# Patient Record
Sex: Female | Born: 1935 | Race: White | Hispanic: No
Health system: Southern US, Community
[De-identification: ages and names within clinical notes are randomized; demographics above are authoritative.]

---

## 1997-06-30 ENCOUNTER — Other Ambulatory Visit: Admission: RE | Admit: 1997-06-30 | Discharge: 1997-06-30 | Payer: Self-pay | Admitting: Obstetrics and Gynecology

## 1997-07-29 ENCOUNTER — Other Ambulatory Visit: Admission: RE | Admit: 1997-07-29 | Discharge: 1997-07-29 | Payer: Self-pay | Admitting: Obstetrics and Gynecology

## 1997-08-27 ENCOUNTER — Encounter: Admission: RE | Admit: 1997-08-27 | Discharge: 1997-11-25 | Payer: Self-pay | Admitting: Gynecology

## 1997-09-04 ENCOUNTER — Encounter: Admission: RE | Admit: 1997-09-04 | Discharge: 1997-12-03 | Payer: Self-pay | Admitting: *Deleted

## 1997-10-28 ENCOUNTER — Inpatient Hospital Stay (HOSPITAL_COMMUNITY): Admission: RE | Admit: 1997-10-28 | Discharge: 1997-10-30 | Payer: Self-pay | Admitting: *Deleted

## 1997-12-03 ENCOUNTER — Ambulatory Visit: Admission: RE | Admit: 1997-12-03 | Discharge: 1997-12-03 | Payer: Self-pay | Admitting: Gynecology

## 1998-01-22 ENCOUNTER — Ambulatory Visit (HOSPITAL_COMMUNITY): Admission: RE | Admit: 1998-01-22 | Discharge: 1998-01-22 | Payer: Self-pay | Admitting: Obstetrics and Gynecology

## 1998-01-22 ENCOUNTER — Encounter: Payer: Self-pay | Admitting: Obstetrics and Gynecology

## 1998-02-12 ENCOUNTER — Other Ambulatory Visit: Admission: RE | Admit: 1998-02-12 | Discharge: 1998-02-12 | Payer: Self-pay | Admitting: Obstetrics and Gynecology

## 1998-03-20 ENCOUNTER — Encounter: Payer: Self-pay | Admitting: Obstetrics and Gynecology

## 1998-03-20 ENCOUNTER — Ambulatory Visit (HOSPITAL_COMMUNITY): Admission: RE | Admit: 1998-03-20 | Discharge: 1998-03-20 | Payer: Self-pay | Admitting: Obstetrics and Gynecology

## 1998-06-22 ENCOUNTER — Other Ambulatory Visit: Admission: RE | Admit: 1998-06-22 | Discharge: 1998-06-22 | Payer: Self-pay | Admitting: Obstetrics and Gynecology

## 1998-09-14 ENCOUNTER — Ambulatory Visit (HOSPITAL_COMMUNITY): Admission: RE | Admit: 1998-09-14 | Discharge: 1998-09-14 | Payer: Self-pay | Admitting: Obstetrics and Gynecology

## 1998-09-14 ENCOUNTER — Encounter: Payer: Self-pay | Admitting: Obstetrics and Gynecology

## 1998-09-29 ENCOUNTER — Other Ambulatory Visit: Admission: RE | Admit: 1998-09-29 | Discharge: 1998-09-29 | Payer: Self-pay | Admitting: Obstetrics and Gynecology

## 1999-01-24 ENCOUNTER — Emergency Department (HOSPITAL_COMMUNITY): Admission: EM | Admit: 1999-01-24 | Discharge: 1999-01-24 | Payer: Self-pay

## 1999-03-14 ENCOUNTER — Encounter: Payer: Self-pay | Admitting: Emergency Medicine

## 1999-03-14 ENCOUNTER — Emergency Department (HOSPITAL_COMMUNITY): Admission: EM | Admit: 1999-03-14 | Discharge: 1999-03-14 | Payer: Self-pay | Admitting: Emergency Medicine

## 1999-04-16 ENCOUNTER — Ambulatory Visit (HOSPITAL_COMMUNITY): Admission: RE | Admit: 1999-04-16 | Discharge: 1999-04-16 | Payer: Self-pay | Admitting: Gastroenterology

## 1999-04-16 ENCOUNTER — Encounter: Payer: Self-pay | Admitting: Gastroenterology

## 1999-06-21 ENCOUNTER — Other Ambulatory Visit: Admission: RE | Admit: 1999-06-21 | Discharge: 1999-06-21 | Payer: Self-pay | Admitting: Obstetrics and Gynecology

## 1999-09-21 ENCOUNTER — Encounter: Admission: RE | Admit: 1999-09-21 | Discharge: 1999-09-21 | Payer: Self-pay | Admitting: Obstetrics and Gynecology

## 1999-09-21 ENCOUNTER — Encounter: Payer: Self-pay | Admitting: Obstetrics and Gynecology

## 2000-01-19 ENCOUNTER — Encounter: Admission: RE | Admit: 2000-01-19 | Discharge: 2000-01-19 | Payer: Self-pay | Admitting: Gastroenterology

## 2000-01-19 ENCOUNTER — Encounter: Payer: Self-pay | Admitting: Gastroenterology

## 2000-01-28 ENCOUNTER — Encounter: Payer: Self-pay | Admitting: Gastroenterology

## 2000-01-28 ENCOUNTER — Encounter: Admission: RE | Admit: 2000-01-28 | Discharge: 2000-01-28 | Payer: Self-pay | Admitting: Gastroenterology

## 2000-02-29 ENCOUNTER — Ambulatory Visit (HOSPITAL_COMMUNITY): Admission: RE | Admit: 2000-02-29 | Discharge: 2000-02-29 | Payer: Self-pay | Admitting: Gastroenterology

## 2000-10-20 ENCOUNTER — Encounter: Payer: Self-pay | Admitting: Emergency Medicine

## 2000-10-20 ENCOUNTER — Encounter: Payer: Self-pay | Admitting: Orthopedic Surgery

## 2000-10-20 ENCOUNTER — Inpatient Hospital Stay (HOSPITAL_COMMUNITY): Admission: EM | Admit: 2000-10-20 | Discharge: 2000-10-23 | Payer: Self-pay | Admitting: Emergency Medicine

## 2001-04-16 ENCOUNTER — Other Ambulatory Visit: Admission: RE | Admit: 2001-04-16 | Discharge: 2001-04-16 | Payer: Self-pay | Admitting: Obstetrics and Gynecology

## 2001-05-15 ENCOUNTER — Encounter: Payer: Self-pay | Admitting: Gastroenterology

## 2001-05-15 ENCOUNTER — Encounter: Admission: RE | Admit: 2001-05-15 | Discharge: 2001-05-15 | Payer: Self-pay | Admitting: Gastroenterology

## 2001-08-03 ENCOUNTER — Encounter: Admission: RE | Admit: 2001-08-03 | Discharge: 2001-08-03 | Payer: Self-pay | Admitting: Obstetrics and Gynecology

## 2001-08-03 ENCOUNTER — Encounter: Payer: Self-pay | Admitting: Obstetrics and Gynecology

## 2001-08-30 ENCOUNTER — Emergency Department (HOSPITAL_COMMUNITY): Admission: EM | Admit: 2001-08-30 | Discharge: 2001-08-30 | Payer: Self-pay | Admitting: Emergency Medicine

## 2001-08-30 ENCOUNTER — Encounter: Payer: Self-pay | Admitting: Emergency Medicine

## 2002-12-07 ENCOUNTER — Inpatient Hospital Stay (HOSPITAL_COMMUNITY): Admission: RE | Admit: 2002-12-07 | Discharge: 2002-12-10 | Payer: Self-pay | Admitting: Family Medicine

## 2002-12-08 ENCOUNTER — Encounter: Payer: Self-pay | Admitting: Family Medicine

## 2002-12-11 ENCOUNTER — Encounter: Admission: RE | Admit: 2002-12-11 | Discharge: 2002-12-11 | Payer: Self-pay | Admitting: Family Medicine

## 2003-03-19 ENCOUNTER — Emergency Department (HOSPITAL_COMMUNITY): Admission: EM | Admit: 2003-03-19 | Discharge: 2003-03-19 | Payer: Self-pay | Admitting: Emergency Medicine

## 2003-03-26 ENCOUNTER — Ambulatory Visit (HOSPITAL_COMMUNITY): Admission: RE | Admit: 2003-03-26 | Discharge: 2003-03-26 | Payer: Self-pay | Admitting: Gastroenterology

## 2003-07-23 ENCOUNTER — Ambulatory Visit (HOSPITAL_COMMUNITY): Admission: RE | Admit: 2003-07-23 | Discharge: 2003-07-23 | Payer: Self-pay | Admitting: Gastroenterology

## 2004-12-26 ENCOUNTER — Emergency Department: Payer: Self-pay | Admitting: Emergency Medicine

## 2005-01-07 ENCOUNTER — Ambulatory Visit: Payer: Self-pay | Admitting: Unknown Physician Specialty

## 2005-02-01 ENCOUNTER — Inpatient Hospital Stay: Payer: Self-pay | Admitting: Unknown Physician Specialty

## 2005-02-01 ENCOUNTER — Other Ambulatory Visit: Payer: Self-pay

## 2005-02-10 ENCOUNTER — Encounter: Payer: Self-pay | Admitting: Internal Medicine

## 2005-02-11 ENCOUNTER — Encounter: Payer: Self-pay | Admitting: Internal Medicine

## 2005-03-14 ENCOUNTER — Encounter: Payer: Self-pay | Admitting: Internal Medicine

## 2005-04-08 ENCOUNTER — Ambulatory Visit: Payer: Self-pay | Admitting: Internal Medicine

## 2005-04-14 ENCOUNTER — Encounter: Payer: Self-pay | Admitting: Internal Medicine

## 2005-05-13 ENCOUNTER — Inpatient Hospital Stay: Payer: Self-pay | Admitting: Internal Medicine

## 2005-05-13 ENCOUNTER — Other Ambulatory Visit: Payer: Self-pay

## 2005-05-20 ENCOUNTER — Encounter: Payer: Self-pay | Admitting: Internal Medicine

## 2005-06-12 ENCOUNTER — Encounter: Payer: Self-pay | Admitting: Internal Medicine

## 2005-07-12 ENCOUNTER — Encounter: Payer: Self-pay | Admitting: Internal Medicine

## 2005-08-12 ENCOUNTER — Encounter: Payer: Self-pay | Admitting: Internal Medicine

## 2005-09-11 ENCOUNTER — Encounter: Payer: Self-pay | Admitting: Internal Medicine

## 2005-10-12 ENCOUNTER — Encounter: Payer: Self-pay | Admitting: Internal Medicine

## 2005-11-12 ENCOUNTER — Encounter: Payer: Self-pay | Admitting: Internal Medicine

## 2005-12-05 ENCOUNTER — Emergency Department: Payer: Self-pay | Admitting: Emergency Medicine

## 2005-12-12 ENCOUNTER — Encounter: Payer: Self-pay | Admitting: Internal Medicine

## 2006-01-07 ENCOUNTER — Emergency Department: Payer: Self-pay | Admitting: Internal Medicine

## 2006-01-12 ENCOUNTER — Encounter: Payer: Self-pay | Admitting: Internal Medicine

## 2006-04-01 ENCOUNTER — Emergency Department: Payer: Self-pay | Admitting: General Practice

## 2006-08-13 ENCOUNTER — Ambulatory Visit: Payer: Self-pay | Admitting: Internal Medicine

## 2008-03-24 ENCOUNTER — Emergency Department: Payer: Self-pay | Admitting: Emergency Medicine

## 2008-08-30 ENCOUNTER — Emergency Department: Payer: Self-pay | Admitting: Emergency Medicine

## 2008-10-09 ENCOUNTER — Emergency Department: Payer: Self-pay | Admitting: Emergency Medicine

## 2008-11-11 ENCOUNTER — Inpatient Hospital Stay: Payer: Self-pay | Admitting: Internal Medicine

## 2010-04-04 ENCOUNTER — Encounter: Payer: Self-pay | Admitting: Gastroenterology

## 2010-12-02 ENCOUNTER — Emergency Department: Payer: Self-pay | Admitting: Internal Medicine

## 2011-04-20 ENCOUNTER — Emergency Department: Payer: Self-pay

## 2011-05-20 IMAGING — CT CT HEAD WITHOUT CONTRAST
2 series · 16 of 30 positions shown, 20 images · non-contrast
Comparison: none

REASON FOR EXAM: fall
COMMENTS:

[Series 2: without · axial · non-contrast · 0.44mm/px · z∈[-236,-116]mm · 13 of 30 slices shown, 17 images]
[im 3/30  brain]
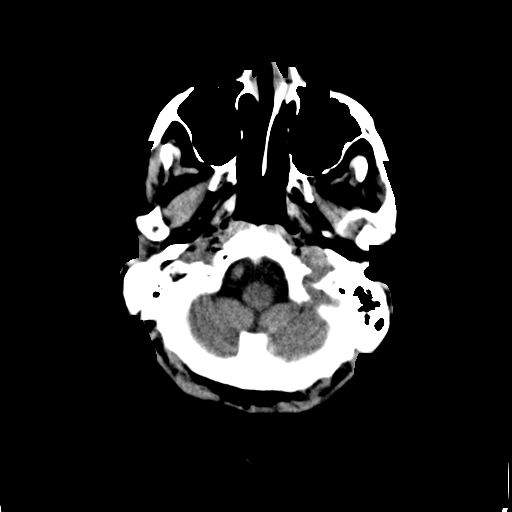
[im 3/30  bone]
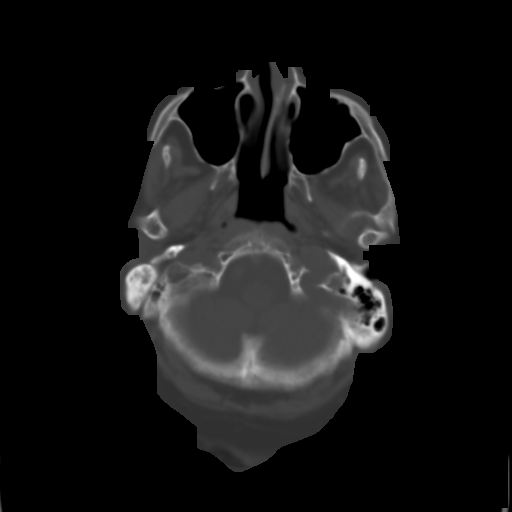
[im 5/30  brain]
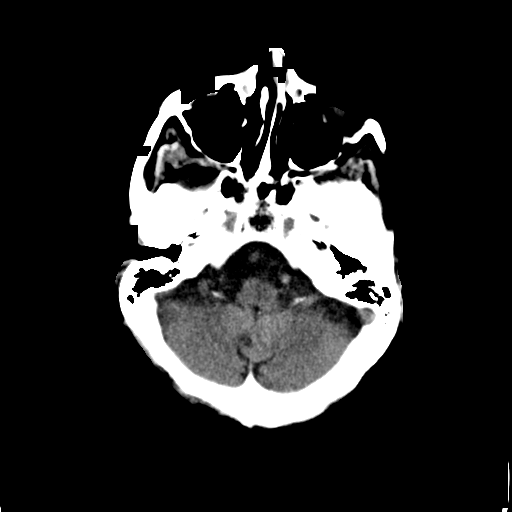
[im 7/30  brain]
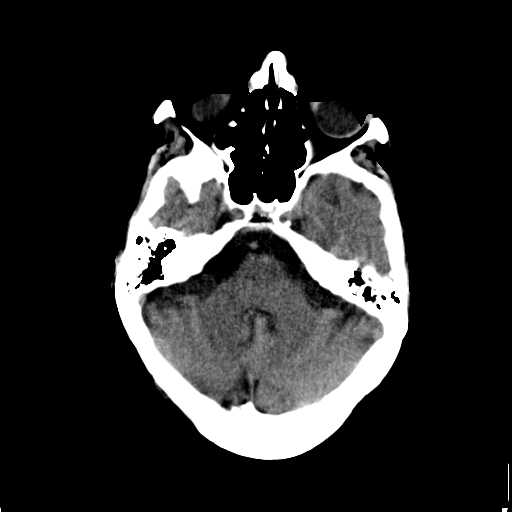
[im 9/30  brain]
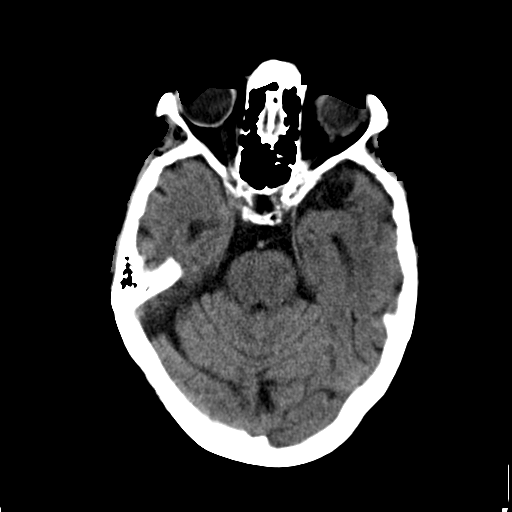
[im 11/30  brain]
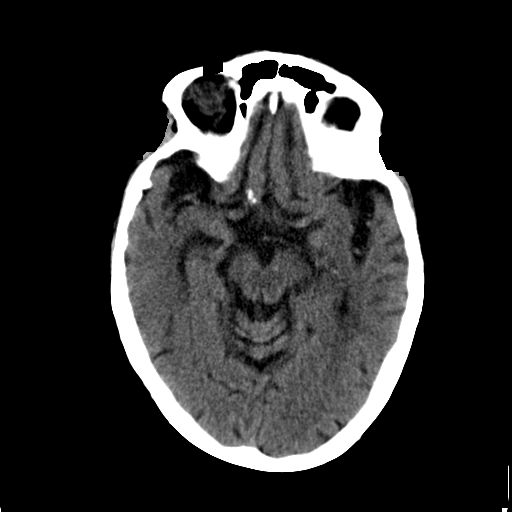
[im 11/30  bone]
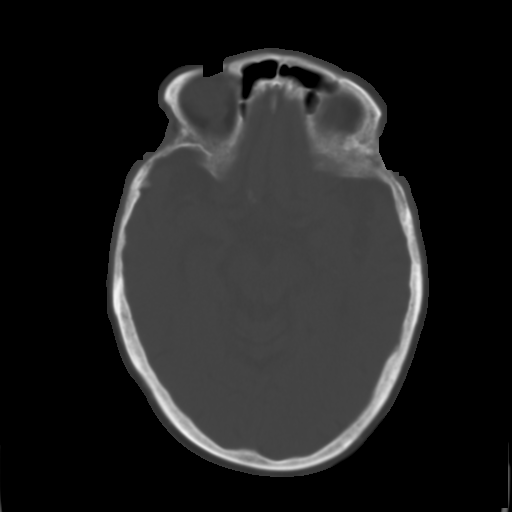
[im 13/30  brain]
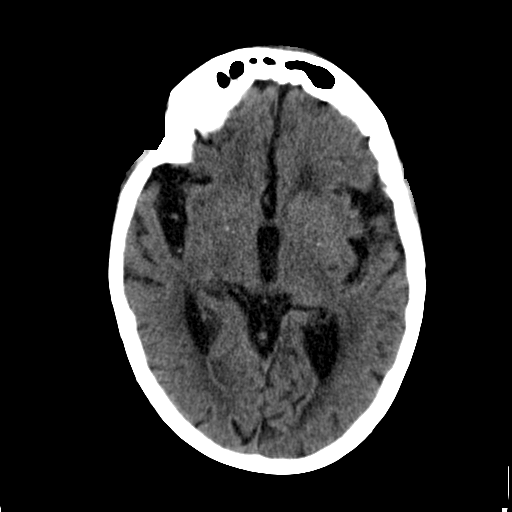
[im 15/30  brain]
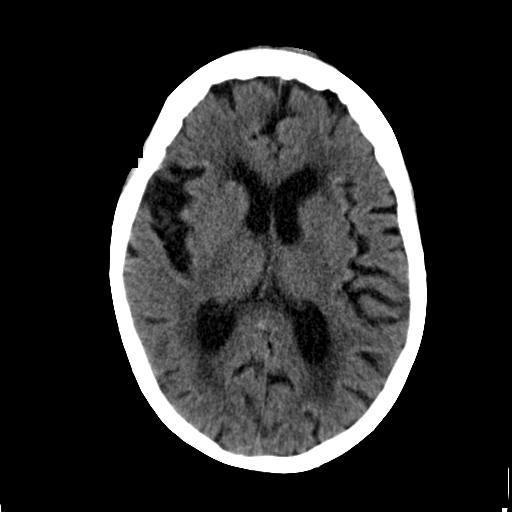
[im 17/30  brain]
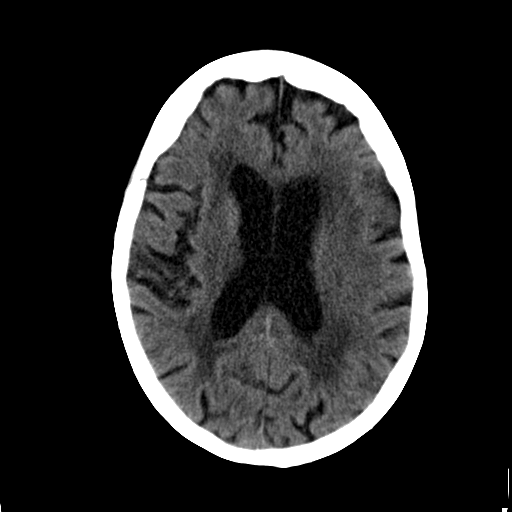
[im 19/30  brain]
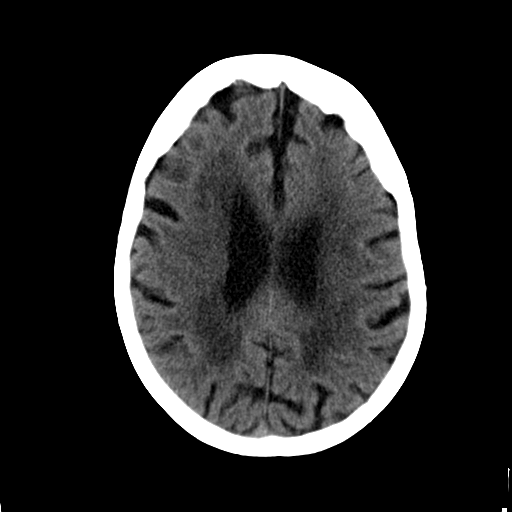
[im 19/30  bone]
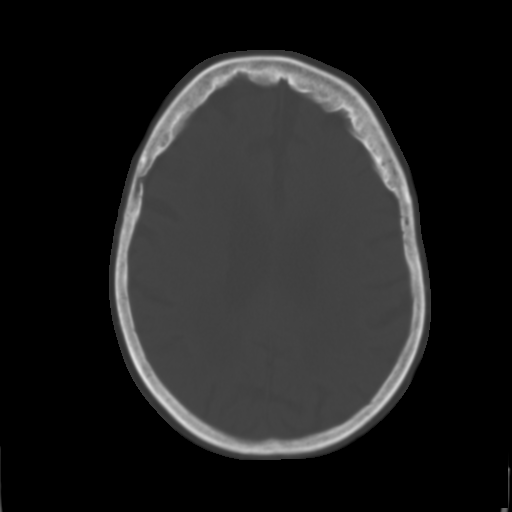
[im 21/30  brain]
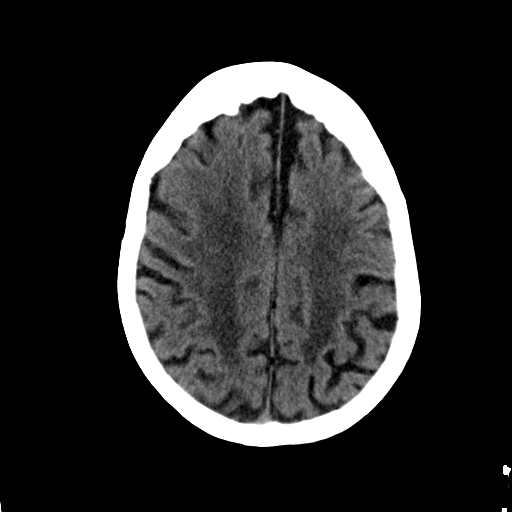
[im 23/30  brain]
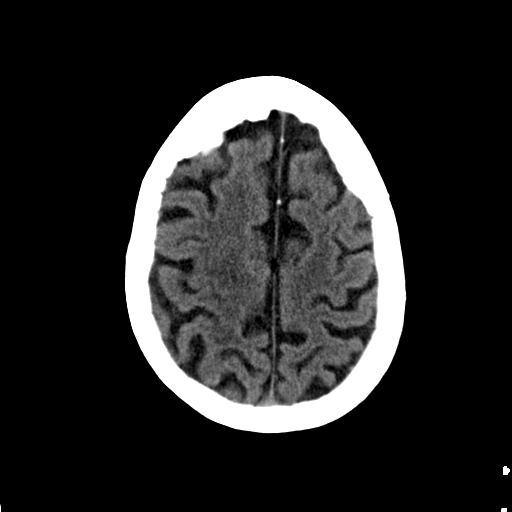
[im 25/30  brain]
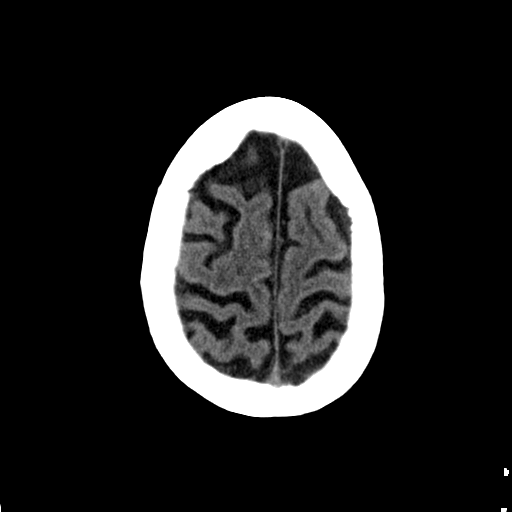
[im 27/30  brain]
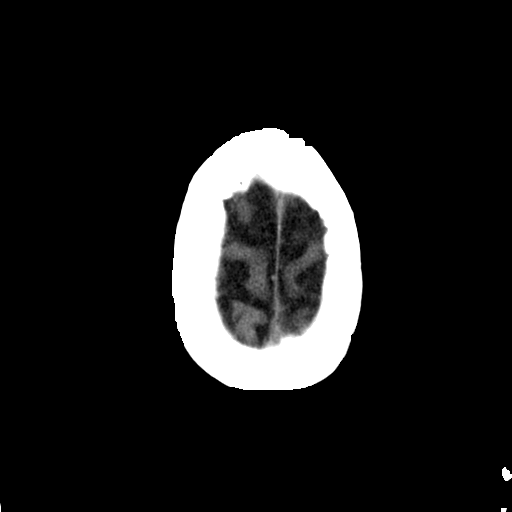
[im 27/30  bone]
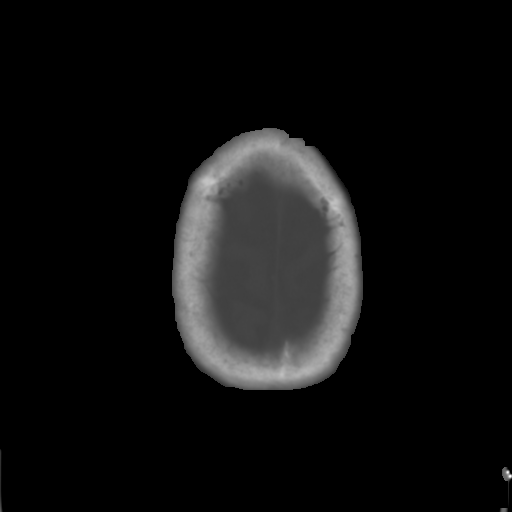

[Series 3: bone · axial · 0.44mm/px · z∈[-236,-196]mm · 3 of 30 slices shown]
[im 3/30  bone]
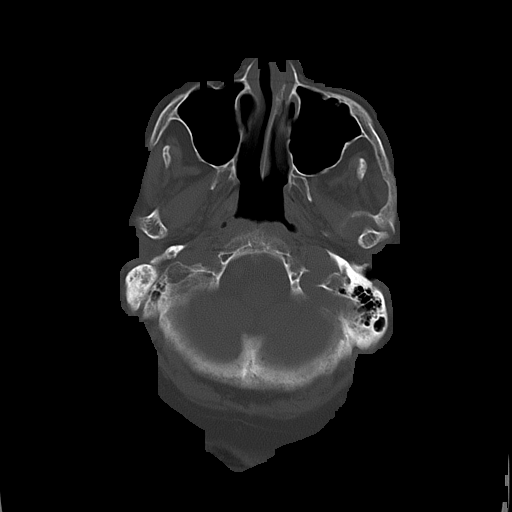
[im 7/30  bone]
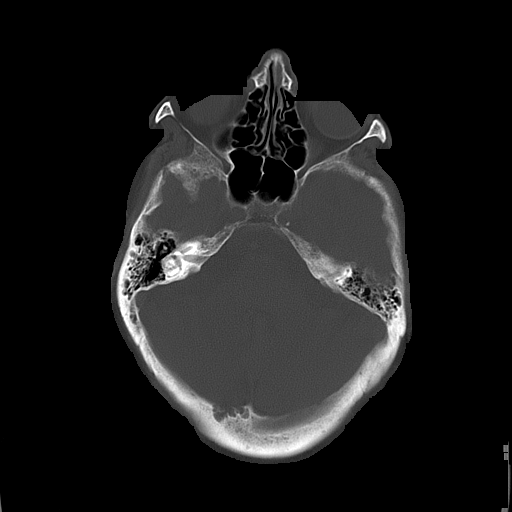
[im 11/30  bone]
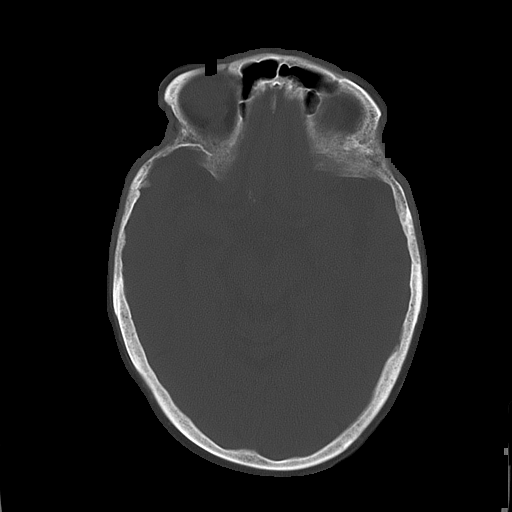

[16 of 30 positions shown; findings below may reference images not displayed]

PROCEDURE:     CT  - CT HEAD WITHOUT CONTRAST  - October 09, 2008  [DATE]

RESULT:     Noncontrast emergent CT of the brain is compared to a study of
05/13/2005.

There is prominence of the ventricles and sulci consistent with atrophy.
There is ill-defined low-attenuation in the periventricular and subcortical
white matter regions which is most consistent with chronic small vessel
ischemic disease. There is no parenchymal or extra-axial hemorrhage or
hematoma. There is no mass effect or midline shift. There is no large
territorial infarct. The included paranasal sinuses, mastoid air cells,
orbits and calvarium appear unremarkable.
IMPRESSION: 1. Atrophy with chronic small vessel ischemic disease. Stable appearance.
2. No acute intracranial abnormality.

## 2011-05-24 ENCOUNTER — Inpatient Hospital Stay: Payer: Self-pay | Admitting: Internal Medicine

## 2011-05-24 LAB — URINALYSIS, COMPLETE
Blood: NEGATIVE
Glucose,UR: NEGATIVE mg/dL (ref 0–75)
Granular Cast: 4
Hyaline Cast: 6
Nitrite: NEGATIVE
Ph: 5 (ref 4.5–8.0)
RBC,UR: 1 /HPF (ref 0–5)
Squamous Epithelial: 1
Transitional Epi: <1
WBC UR: 16 /HPF (ref 0–5)

## 2011-05-24 LAB — CBC
HCT: 29.3 % — ABNORMAL LOW (ref 35.0–47.0)
HGB: 9.7 g/dL — ABNORMAL LOW (ref 12.0–16.0)
MCH: 30.4 pg (ref 26.0–34.0)
MCHC: 33.2 g/dL (ref 32.0–36.0)
MCV: 92 fL (ref 80–100)
Platelet: 168 10*3/uL (ref 150–440)
RBC: 3.19 10*6/uL — ABNORMAL LOW (ref 3.80–5.20)
RDW: 15.3 % — ABNORMAL HIGH (ref 11.5–14.5)
WBC: 8.1 10*3/uL (ref 3.6–11.0)

## 2011-05-24 LAB — COMPREHENSIVE METABOLIC PANEL
Albumin: 3.1 g/dL — ABNORMAL LOW (ref 3.4–5.0)
Alkaline Phosphatase: 88 U/L (ref 50–136)
Anion Gap: 16 (ref 7–16)
BUN: 38 mg/dL — ABNORMAL HIGH (ref 7–18)
Calcium, Total: 8.3 mg/dL — ABNORMAL LOW (ref 8.5–10.1)
Chloride: 100 mmol/L (ref 98–107)
Co2: 17 mmol/L — ABNORMAL LOW (ref 21–32)
Creatinine: 1.15 mg/dL (ref 0.60–1.30)
EGFR (African American): 56 — ABNORMAL LOW
Glucose: 73 mg/dL (ref 65–99)
Osmolality: 274 (ref 275–301)
Potassium: 5 mmol/L (ref 3.5–5.1)
SGOT(AST): 22 U/L (ref 15–37)
SGPT (ALT): 6 U/L — ABNORMAL LOW
Sodium: 133 mmol/L — ABNORMAL LOW (ref 136–145)
Total Protein: 6.9 g/dL (ref 6.4–8.2)

## 2011-05-24 LAB — CK TOTAL AND CKMB (NOT AT ARMC)
CK, Total: 72 U/L (ref 21–215)
CK-MB: 1.2 ng/mL (ref 0.5–3.6)

## 2011-05-24 LAB — TROPONIN I: Troponin-I: <0.02 ng/mL

## 2011-05-25 LAB — BASIC METABOLIC PANEL
Anion Gap: 12 (ref 7–16)
BUN: 30 mg/dL — ABNORMAL HIGH (ref 7–18)
Calcium, Total: 7.8 mg/dL — ABNORMAL LOW (ref 8.5–10.1)
Chloride: 101 mmol/L (ref 98–107)
Co2: 20 mmol/L — ABNORMAL LOW (ref 21–32)
EGFR (Non-African Amer.): 56 — ABNORMAL LOW
Glucose: 61 mg/dL — ABNORMAL LOW (ref 65–99)
Osmolality: 270 (ref 275–301)
Sodium: 133 mmol/L — ABNORMAL LOW (ref 136–145)

## 2011-05-25 LAB — CBC WITH DIFFERENTIAL/PLATELET
Basophil #: 0 10*3/uL (ref 0.0–0.1)
Basophil %: 0.3 %
Eosinophil #: 0.2 10*3/uL (ref 0.0–0.7)
HCT: 25.3 % — ABNORMAL LOW (ref 35.0–47.0)
HGB: 8.3 g/dL — ABNORMAL LOW (ref 12.0–16.0)
Lymphocyte #: 1 10*3/uL (ref 1.0–3.6)
Lymphocyte %: 17.5 %
MCHC: 32.7 g/dL (ref 32.0–36.0)
MCV: 93 fL (ref 80–100)
Monocyte %: 13.6 %
Neutrophil #: 3.9 10*3/uL (ref 1.4–6.5)
Neutrophil %: 65.6 %
Platelet: 144 10*3/uL — ABNORMAL LOW (ref 150–440)
RDW: 15.2 % — ABNORMAL HIGH (ref 11.5–14.5)
WBC: 6 10*3/uL (ref 3.6–11.0)

## 2011-05-25 LAB — IRON AND TIBC
Iron Bind.Cap.(Total): 240 ug/dL — ABNORMAL LOW (ref 250–450)
Iron Saturation: 15 %
Iron: 37 ug/dL — ABNORMAL LOW (ref 50–170)
Unbound Iron-Bind.Cap.: 203 ug/dL

## 2011-05-25 LAB — FERRITIN: Ferritin (ARMC): 131 ng/mL (ref 8–388)

## 2011-05-26 LAB — BASIC METABOLIC PANEL
Anion Gap: 13 (ref 7–16)
BUN: 21 mg/dL — ABNORMAL HIGH (ref 7–18)
Calcium, Total: 7.9 mg/dL — ABNORMAL LOW (ref 8.5–10.1)
Chloride: 105 mmol/L (ref 98–107)
Co2: 19 mmol/L — ABNORMAL LOW (ref 21–32)
EGFR (Non-African Amer.): 57 — ABNORMAL LOW
Glucose: 89 mg/dL (ref 65–99)
Osmolality: 276 (ref 275–301)
Potassium: 4.4 mmol/L (ref 3.5–5.1)
Sodium: 137 mmol/L (ref 136–145)

## 2011-05-26 LAB — OCCULT BLOOD X 1 CARD TO LAB, STOOL: Occult Blood, Feces: NEGATIVE

## 2011-05-26 LAB — CBC WITH DIFFERENTIAL/PLATELET
Basophil #: 0 10*3/uL (ref 0.0–0.1)
Eosinophil #: 0.1 10*3/uL (ref 0.0–0.7)
HCT: 27.1 % — ABNORMAL LOW (ref 35.0–47.0)
HGB: 9.1 g/dL — ABNORMAL LOW (ref 12.0–16.0)
Lymphocyte #: 0.8 10*3/uL — ABNORMAL LOW (ref 1.0–3.6)
Lymphocyte %: 11.1 %
MCH: 30.5 pg (ref 26.0–34.0)
MCHC: 33.5 g/dL (ref 32.0–36.0)
MCV: 91 fL (ref 80–100)
Monocyte #: 0.7 10*3/uL (ref 0.0–0.7)
Monocyte %: 10.4 %
Neutrophil #: 5.2 10*3/uL (ref 1.4–6.5)
Neutrophil %: 76.3 %
Platelet: 155 10*3/uL (ref 150–440)
RDW: 15.9 % — ABNORMAL HIGH (ref 11.5–14.5)
WBC: 6.8 10*3/uL (ref 3.6–11.0)

## 2011-05-26 LAB — URINE CULTURE

## 2012-03-14 DEATH — deceased

## 2013-11-28 IMAGING — CR DG SHOULDER 3+V*R*
1 series · 3 of 3 positions shown · non-contrast
Comparison: None

REASON FOR EXAM: COMMENTS:

PROCEDURE:     DXR - DXR SHOULDER RIGHT COMPLETE  - April 20, 2011  [DATE]
RESULT:      History: Fall

[Series 1: x shoulder ap right · 0.14mm/px · 3 of 3 slices shown]
[im 1/3]
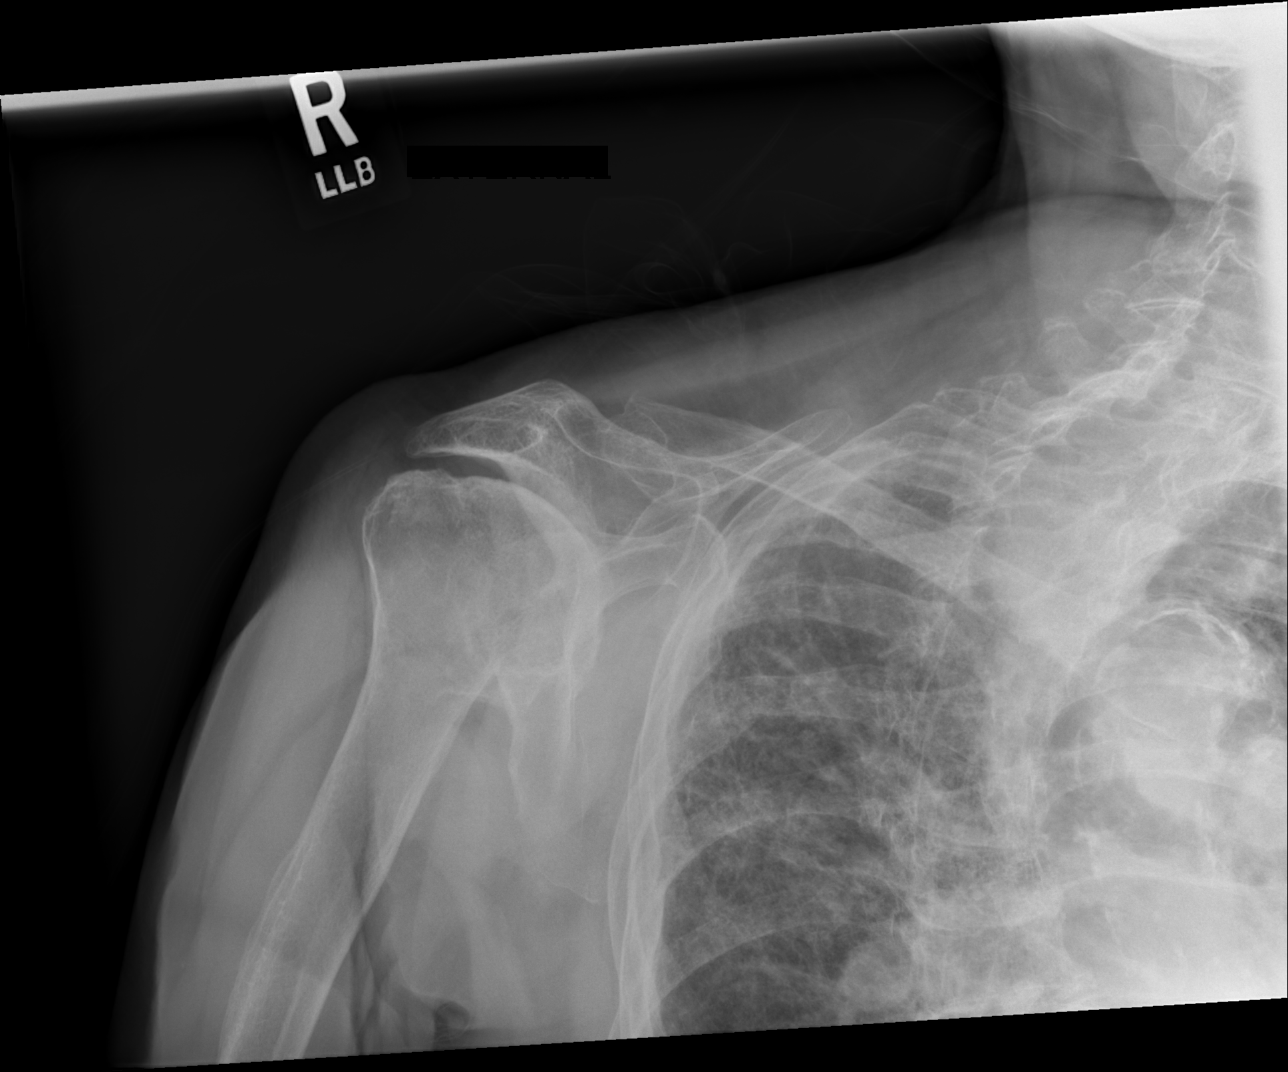
[im 2/3]
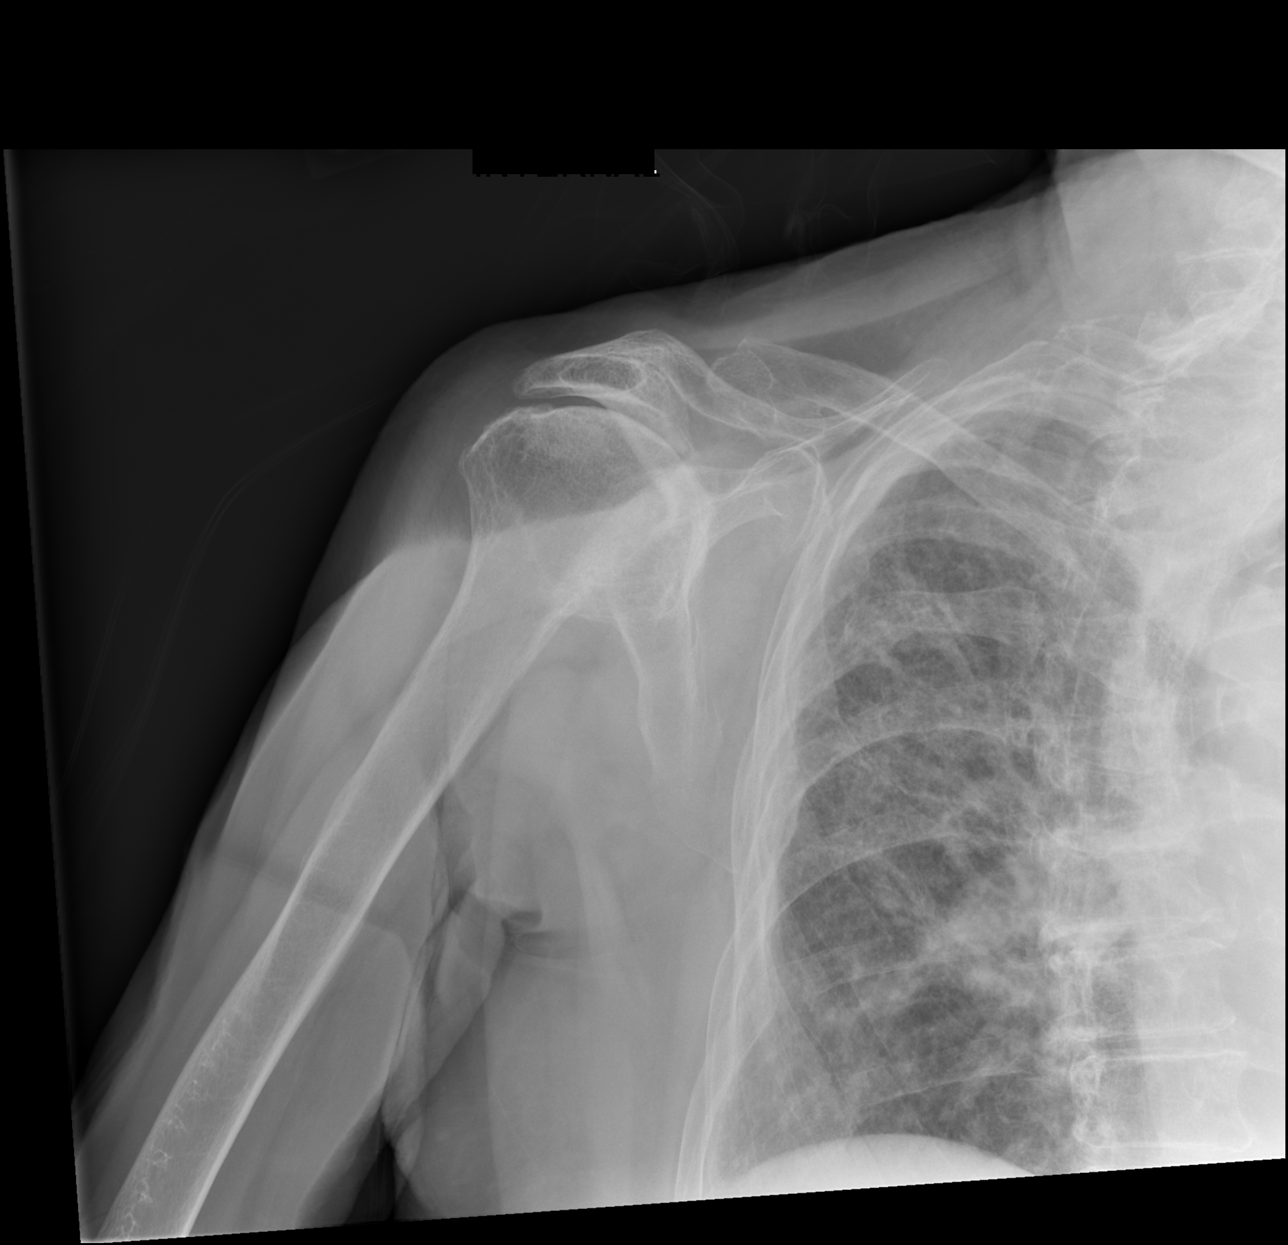
[im 3/3]
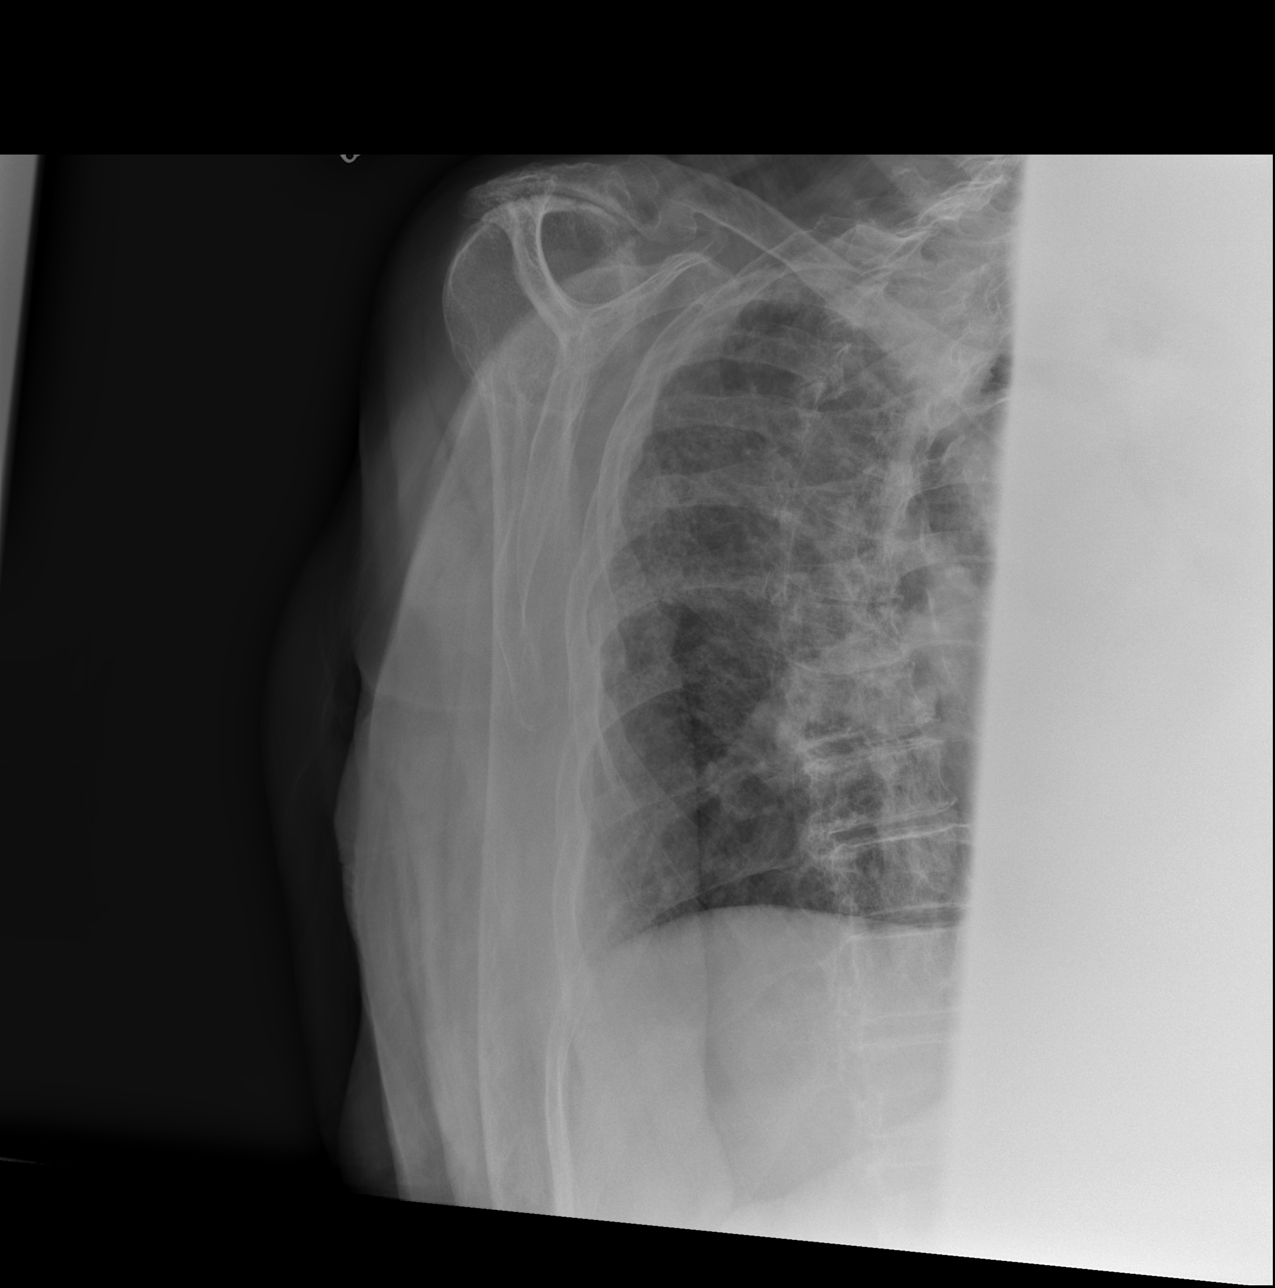

[3 of 3 positions shown; findings below may reference images not displayed]

FINDINGS: 3 views of the right shoulder demonstrates no fracture or dislocation. There
are degenerative changes of the right glenohumeral joint. There is an old
right distal clavicular fracture. There is also normal acromion humeral
distance as can be seen with a rotator cuff tear.
IMPRESSION: No acute osseous injury of the right shoulder.

## 2013-11-28 IMAGING — CT CT HEAD WITHOUT CONTRAST
1 series · 15 of 30 positions shown, 19 images · non-contrast
Comparison: none

REASON FOR EXAM: fall with head injury/hematoma
COMMENTS:

[Series 2: soft tissue · axial · 0.42mm/px · z∈[-140,+0]mm · 15 of 32 slices shown, 19 images]
[im 2/32  brain]
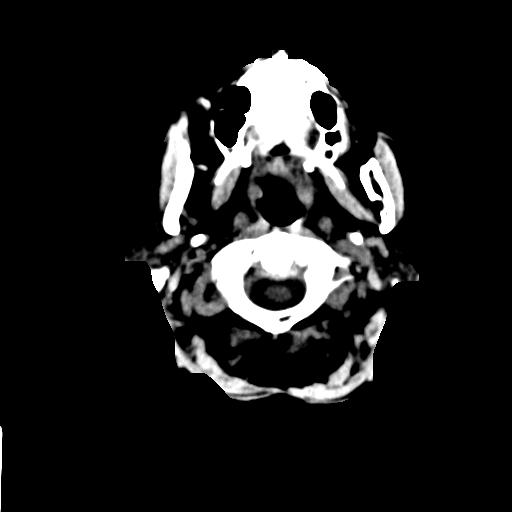
[im 2/32  bone]
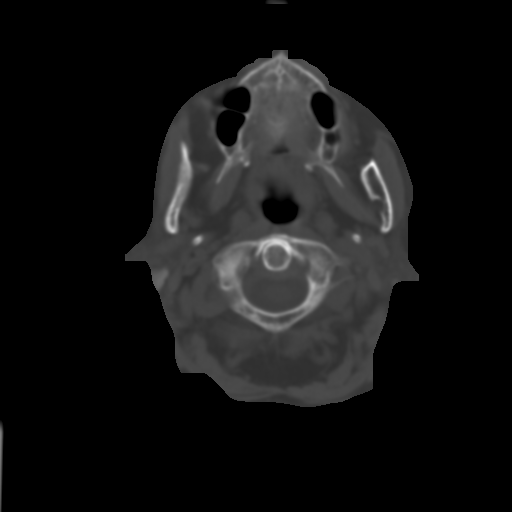
[im 4/32  brain]
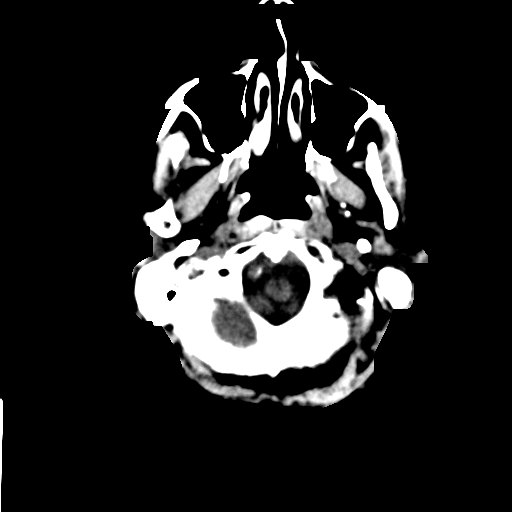
[im 6/32  brain]
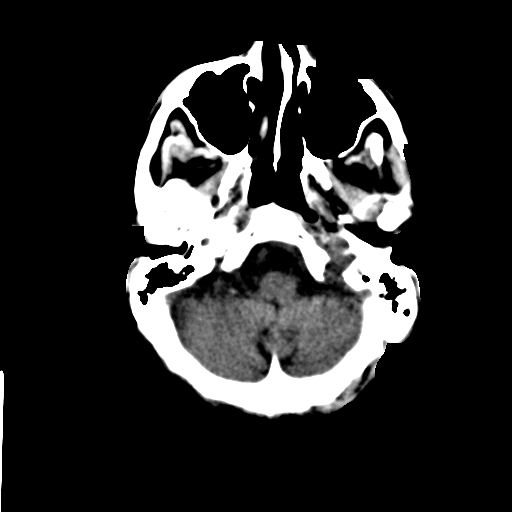
[im 8/32  brain]
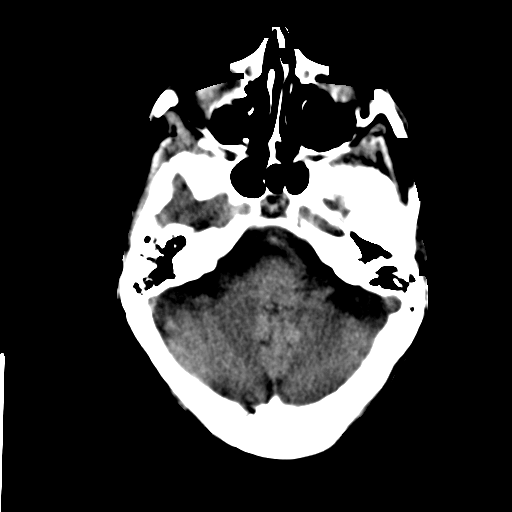
[im 10/32  brain]
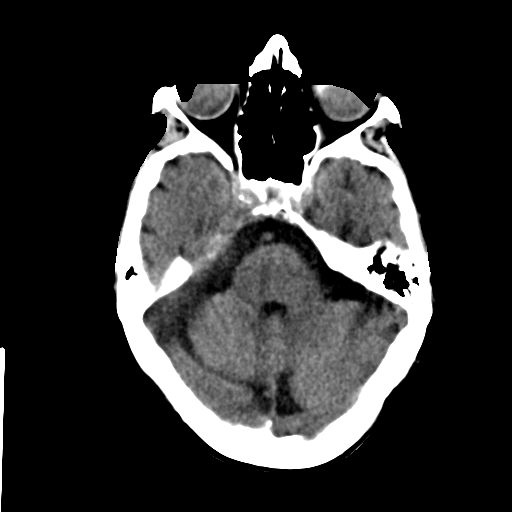
[im 10/32  bone]
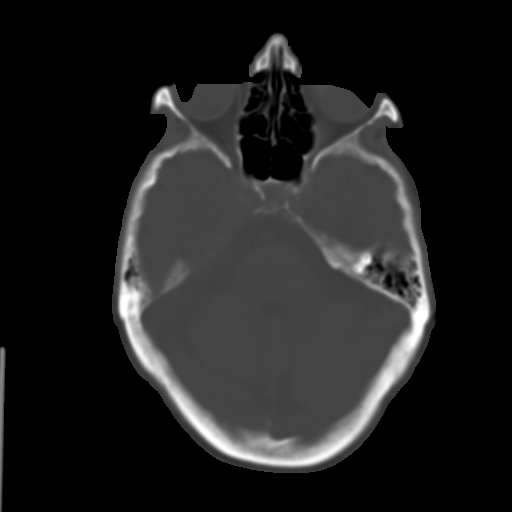
[im 12/32  brain]
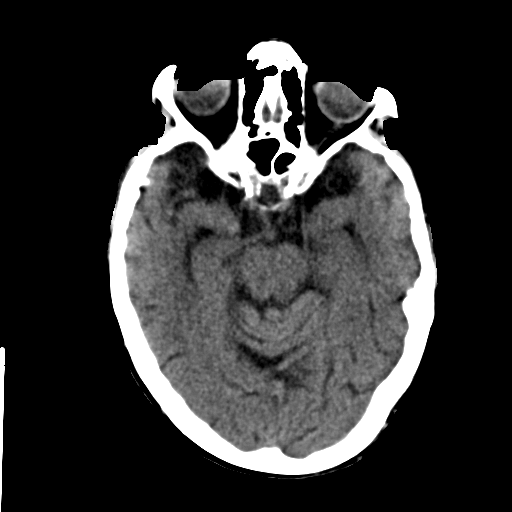
[im 14/32  brain]
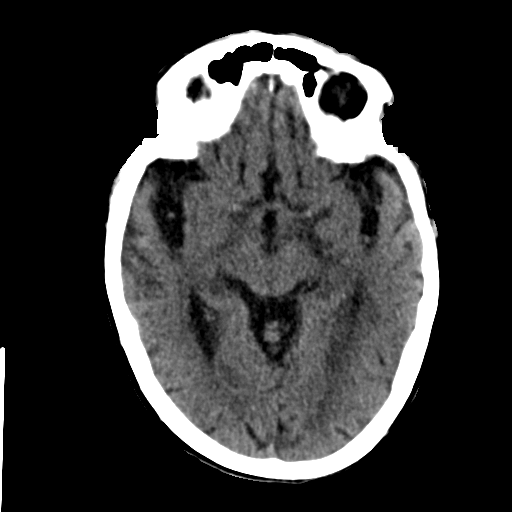
[im 17/32  brain]
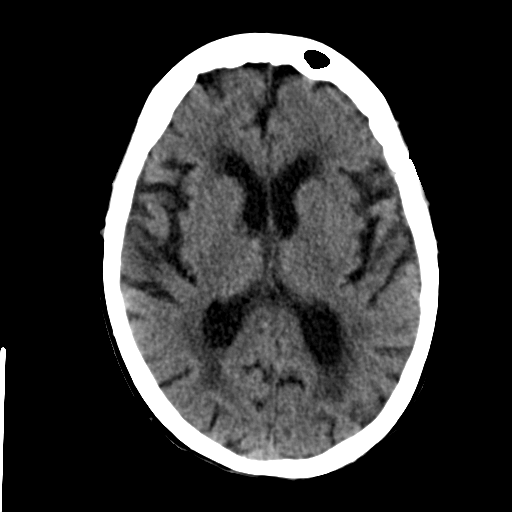
[im 18/32  brain]
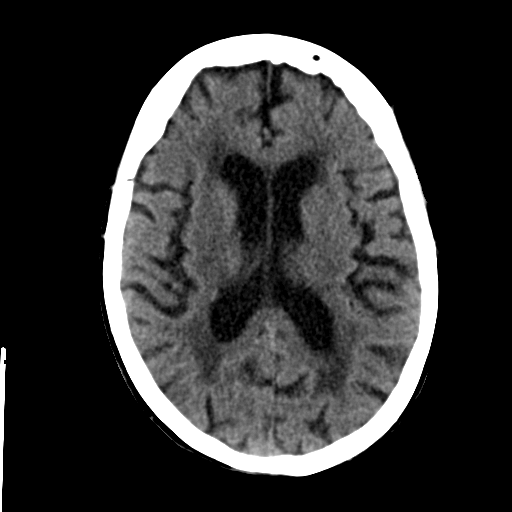
[im 18/32  bone]
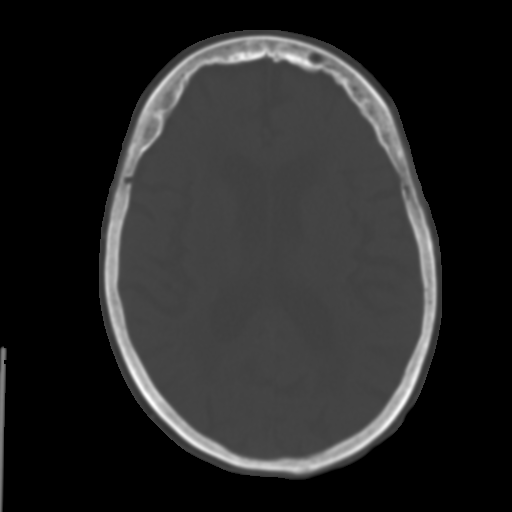
[im 20/32  brain]
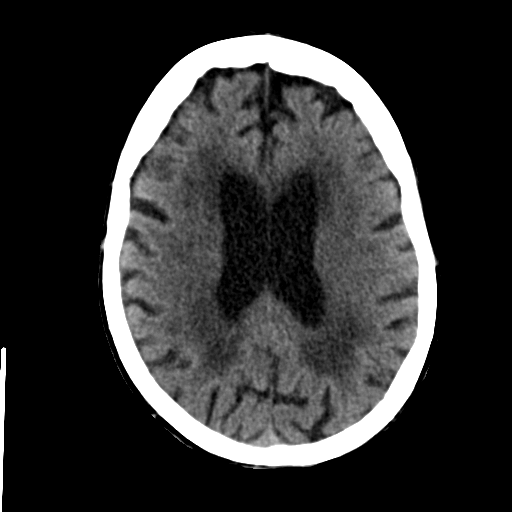
[im 22/32  brain]
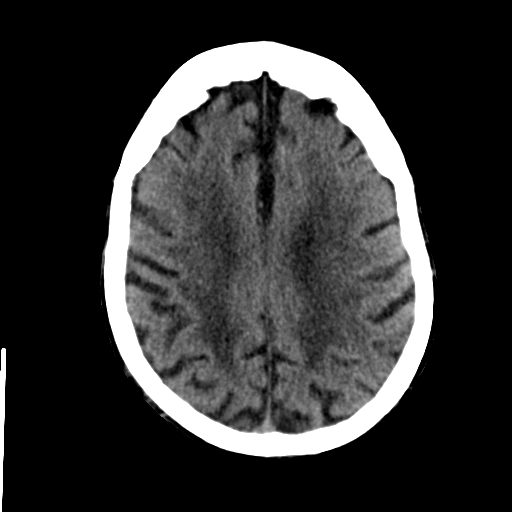
[im 24/32  brain]
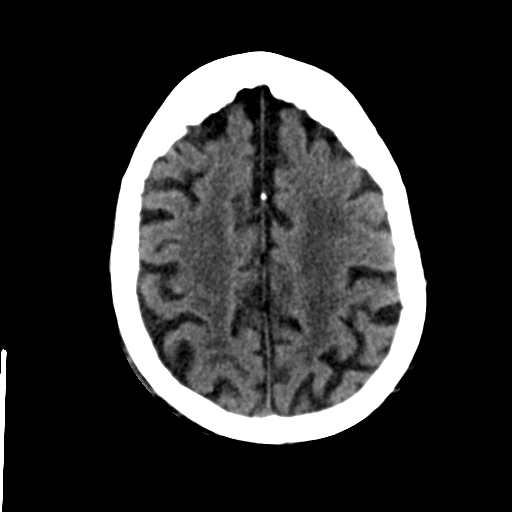
[im 26/32  brain]
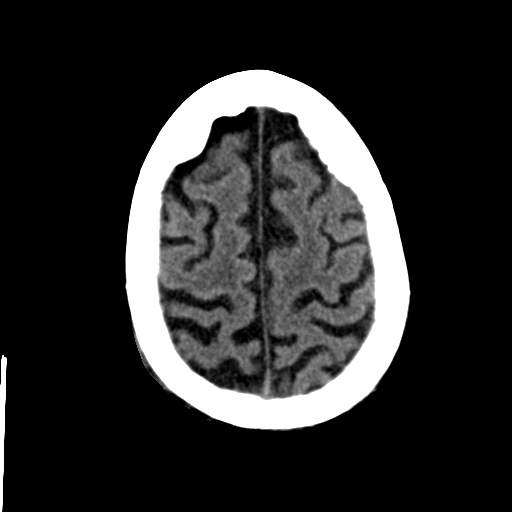
[im 26/32  bone]
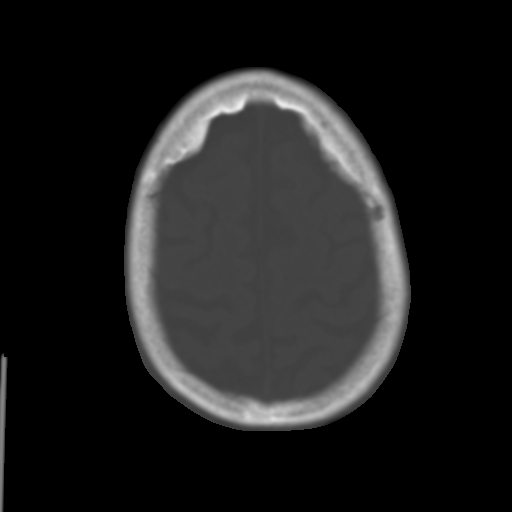
[im 28/32  brain]
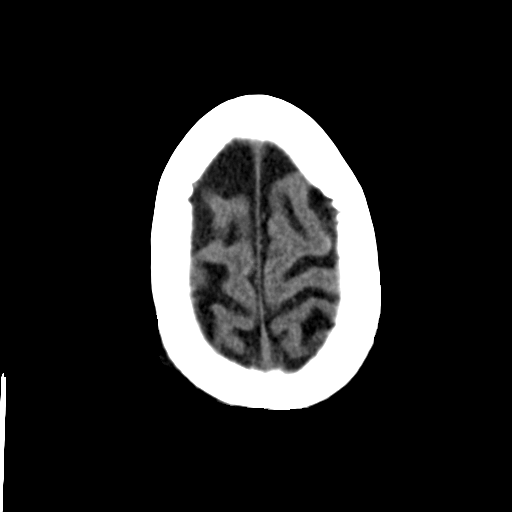
[im 30/32  brain]
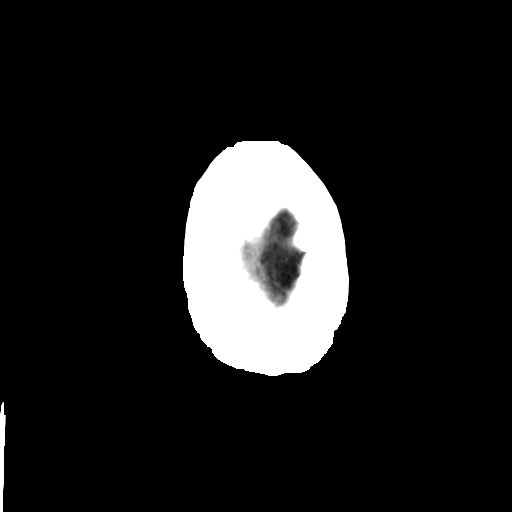

[15 of 30 positions shown; findings below may reference images not displayed]

PROCEDURE:     CT  - CT HEAD WITHOUT CONTRAST  - April 20, 2011  [DATE]

RESULT:     Axial noncontrast CT scanning was performed through the brain
with reconstructions at 5 mm intervals and slice thicknesses. Comparison
made to a study 09 October, 2008.

There is mild to moderate diffuse cerebral and cerebellar atrophy with
compensatory ventriculomegaly. These findings are stable. There is no
evidence of an acute intracranial hemorrhage nor of an acute evolving
ischemic infarction. Punctate basal ganglia calcification is present and
stable on the right. The cerebellum and brainstem exhibit no acute
abnormality. At bone window settings the observed portions of the paranasal
sinuses and mastoid air cells are clear. There is no evidence of an acute
skull fracture. There is a cephalohematoma over the posterior right parietal
bone.
IMPRESSION: 1. I do not see evidence of an acute intracranial hemorrhage.
2. There is no evidence of an evolving ischemic infarction.
3. There are age-related atrophic changes which appear stable.
4. There is a cephalohematoma in the right parietal region. There is no
evidence of an underlying skull fracture.

## 2014-07-06 NOTE — Discharge Summary (Signed)
PATIENT NAME:  Laurie Bartlett, Shanecia MR#:  [id number] DATE OF BIRTH:  08/01/1913  DATE OF ADMISSION:  05/24/2011 DATE OF DISCHARGE:  05/27/2011  REFERRING PHYSICIAN: Dr. Enedina FinnerGoli PRIMARY CARE PHYSICIAN: Dr. Lorie PhenixNancy Maloney  ADMITTING PHYSICIAN: Dr. Larena GlassmanAmir Tine Mabee  DISCHARGING PHYSICIAN: Dr. Larena GlassmanAmir Kipper Buch   REASON FOR ADMISSION: Persistent rib pain after rib fractures from fall. Altered mental status from analgesic medications.   DISCHARGE DIAGNOSES: 1. Intractable pain due to rib fractures from previous fall. 2. Altered mental status secondary to analgesia and urinary tract infection.  3. Urinary tract infection.  4. Rib fractures.  5. Anemia.  6. Parkinson's disease.  7. Hyponatremia.  8. Hypertension.  9. Hyperlipidemia.  10. Depression.  11. Debilitation.   CONSULTANTS:  1. Case management.  2. Physical therapy. 3. Occupational therapy.   LABORATORY, DIAGNOSTIC AND RADIOLOGICAL DATA: Chest x-ray 05/23/2001: No acute cardiopulmonary disease. Mild prominence of pulmonary interstitial was similar to prior and may be related to chronic interstitial lung disease.   HOSPITAL COURSE: Initial history and physical were done by myself. Please refer to my note dated 05/24/2011 for complete details. In brief, this is a 81 year old white female with past medical history of Parkinson's, hyperlipidemia, hypertension who fell two weeks ago broke two ribs and then reinjured it. She has been living in assisted living but since oxycodone and Ultram she has been confused and unable to take care of herself. Her pain has been uncontrolled. She has had difficulty ambulating and she had been confused. She. She was admitted to the hospitalist service.  1. Altered mental status, most likely due to pain and possibly urinary tract infection. We changed her pain medications and she had much improvement. In addition she received some IV hydration and was treated for urinary tract infection. She is now back to baseline and has  been compliant with PT and recommended for rehab.  2. Rib fractures. We changed her Ultram to anti-inflammatory Toradol and morphine. We have now put her on oral tramadol and Toradol and she had been doing well. Because of her pain she will need more physical therapy at rehab.  3. Urinary tract infection. We treated her with ceftriaxone for three days. Urine culture showed no growth.  4. Anemia, likely chronic anemia. We resumed her iron.  5. Parkinson's. We continued her on Sinemet.  6. Hyponatremia. This improved with IV fluids and most likely was component of dehydration from her altered mental status leading to poor p.o. intake.  7. Hypertension. We continued her on Tenormin.  8. Hyperlipidemia. We continued Zocor.  9. Depression. Continued on Zoloft and Remeron. 10. DVT prophylaxis maintained with aspirin and Lovenox.  PHYSICAL EXAMINATION: Patient was discharged on 05/27/2011. VITAL SIGNS: Temperature 98.8, heart rate 68, respiratory rate 18, blood pressure 123/75, sating 94% on room air. LUNGS: Clear to auscultation. CARDIOVASCULAR: Regular rate and rhythm. ABDOMEN: Benign.   DISCHARGE MEDICATIONS: Patient was discharged on the following medications:  1. Vitamin B12 1000 mcg injected once monthly.  2. Ativan 0.5 mg at bedtime p.r.n.  3. Vitamin D3 1000 international units once daily.  4. Sinemet 25/100 t.i.d. before meals. 5. Zoloft 50 mg daily.  6. Xalatan ophthalmic 0.005% both eyes at bedtime.  7. Aspirin 81 mg daily.  8. Tenormin 25 mg daily.  9. Folic acid 1 mg daily.  10. Potassium chloride extended release 10 mg daily.  11. Colace 100 mg 2 times a day.  12. Zocor 20 mg daily.  13. Remeron 15 mg 1 tablet once a day  at bedtime.  14. MiraLax powder 17 grams once daily.  15. Milk of magnesia 30 mL 2 times a day as needed for constipation.  16. Duplex suppository 10 mg rectally once a day as needed for constipation.  17. Lasix 20 mg 0.5 tablet once a day. 18. Tylenol 325-650  mg p.o. q.6 hours p.r.n. pain. 19. Slow Fe 160 mg daily. 20. Tramadol 50 mg p.o. every six hours p.r.n. pain.  21. Toradol 10 mg p.o. q.8 hours p.r.n. pain, 30 tablets was provided for each.   OXYGEN: None.   DIET: Mechanical soft.   ACTIVITY: As tolerated with assistance. Physical therapy five times a week.   FOLLOW UP: Patient is to see Dr. Elease Hashimoto in one week.   CODE STATUS: FULL CODE.   TOTAL TIME SPENT ON DISCHARGE: 50 minutes.   ____________________________ Corie Chiquito Lafayette Dragon, MD aaf:cms D: 05/27/2011 12:59:14 ET T: 05/27/2011 13:27:28 ET JOB#: [id number]  cc: Karolee Ohs A. Lafayette Dragon, MD, <Dictator> Leo Grosser, MD Karolee Ohs Laverda Page MD ELECTRONICALLY SIGNED 05/27/2011 16:32

## 2014-07-06 NOTE — H&P (Signed)
PATIENT NAME:  Laurie Bartlett, Laurie Bartlett MR#:  [id number] DATE OF BIRTH:  February 23, 1914  DATE OF ADMISSION:  05/24/2011  REFERRING PHYSICIAN: Dr. Enedina FinnerGoli   PRIMARY CARE PHYSICIAN: Dr. Lorie PhenixNancy Maloney    REASON FOR ADMISSION: Persistent chest pain from fall, rib fractures, altered mental status from analgesic medications.   HISTORY OF PRESENT ILLNESS: The patient was unable to give history secondary to altered mental status. Niece was by the bedside. This is a 89 year old white female with past medical history of Parkinson's, hyperlipidemia, and hypertension who a couple of weeks ago fell and broke two ribs on the right and has been having pain. Since then she has been progressively declining. She fell again and reinjured her ribs 05/19/2011, had increased pain. She had an x-ray which showed reinjury of her rib fractures on 05/21/2011. She has been taking oxycodone and Ultram and getting confused. She came to the ER and was given Toradol and normal saline. She is still confused and having uncontrolled pain and difficulty ambulating. She lives in assisted living and is unable to take care of herself and needs to be placed in a skilled nursing facility. In addition, she has been not eating well and having signs and symptoms of dehydration as per family. We are asked to admit the patient for altered mental status and uncontrolled rib pain.   PAST MEDICAL HISTORY:  1. Parkinson's. 2. Osteoarthritis.   3. Osteoporosis. 4. Gastroesophageal reflux disease.  5. Depression.  6. Hyperlipidemia.   MEDICATIONS AT HOME:  1. Colace 100 mg b.i.d.  2. Aspirin 81 mg daily.  3. Tenormin 25 mg daily.  4. Nexium 40 mg daily. 5. Folic acid 1 mg daily. 6. Vitamin D3 1000 international units daily.   7. Zoloft 50 mg daily.  8. Lasix one-half of 10 mg daily.  9. Potassium chloride 10 mEq daily.  10. Vitamin B12 500 mcg daily. 11. Xalatan 0.005% drop both eyes at bedtime.  12. Tylenol Extra-Strength 500 mg 2 tablets p.o. b.i.d.   13. Ativan 0.5 mg p.o. at bedtime.  14. Zocor 20 mg daily.  15. Remeron 15 mg daily. 16. MiraLAX 17 grams in 8 ounces p.o. at bedtime.  17. Sinemet 25/100 one tablet p.o. b.i.d.  18. Slow Iron 160 mg p.o. daily. 19. Promethazine 25 mg daily.  20. Zofran 4 mg 1 tablet p.o. q.4 hours. 21. Ultram 50 mg p.o. q.4 hours. 22. Oxycodone 5 mg q.6 hours p.r.n. pain.  23. Oxycodone 5 mg 2 tablets p.o. q.6 hours p.r.n.  24. Dulcolax 10 mg suppository p.r.n.  25. Milk of Magnesia 30 mL p.o. b.i.d.   DRUG ALLERGIES: Alphagan, Keflex, Lumigan.  PAST SURGICAL HISTORY:  1. Cholecystectomy.  2. Appendectomy.  3. Tonsillectomy. 4. Hysterectomy.    FAMILY HISTORY: Mother died of stomach cancer. Father died of diabetes.   SOCIAL HISTORY: She is widowed. One son passed away. Does not smoke, drink, or use illicit drugs. She lives in assisted living. Normally she is able to do her activities of daily living but has not been able to do so over the last two weeks.   REVIEW OF SYSTEMS: Unobtainable secondary to the patient's altered mental status.     PHYSICAL EXAMINATION:   VITAL SIGNS: Temperature 97.4, heart rate 90, respiratory rate 19, blood pressure 138/50, sating 95% on room air.   GENERAL: The patient is well developed, well nourished, looks younger than her stated age.   HEENT: Pupils equal and reactive to light and accommodation. Anicteric sclerae. No difficulty hearing.  Oropharynx clear. She has dry mucous membranes.   NECK: No JVD. No thyromegaly. No lymphadenopathy. No carotid bruits.   LUNGS: Clear to auscultation. No adventitious breath sounds. Resonant to percussion. No increased work of breathing or increased respiratory effort.  BREASTS: No obvious masses.   ABDOMEN: Soft, nontender, nondistended. Positive bowel sounds.   GU: Deferred.   MUSCULOSKELETAL: Strength 5/5. No clubbing, cyanosis, DJD.   SKIN: No rashes, lesions. Warm to touch.   LYMPH: No lymphadenopathy in  cervical or supraclavicular area.   NEUROLOGIC: She is able to follow commands. Cranial nerves II through XII are intact. No dysarthria, aphasia, or dysphagia.   PSYCH: She is alert but gets confused.   LABORATORY DATA: Glucose 73, BUN 38, creatinine 1.15, sodium 133, potassium 5.0, chloride 100, anion gap 16, bicarb 17, total protein 6.9, albumin 3.1, total bilirubin 0.6, alkaline phosphatase 88, AST 22, ALT 6. CK 72. MB 1.2. Troponin less than 0.02. White count 8.1, hemoglobin 9.7, hematocrit 29.3, platelets 168. Urinalysis showed bacteria trace, WBCs 16, leukocyte esterase 1+. Chest x-ray shows no cardiopulmonary process. Mild deformity of the left lateral chest wall similar to prior, likely chronic. There is old right posterior rib fractures. No pneumothorax.  ASSESSMENT AND PLAN: This is a pleasant 81 year old white female with past medical history of Parkinson's, hypertension, and hyperlipidemia with recent fall leading to rib fractures. The patient has altered mental status and uncontrolled pain.  1. Altered mental status most likely due to pain medications, urinary tract infection, and Parkinson's all coupled together. Will treat the urinary tract infection with ceftriaxone. Will get neuro checks.  2. Rib fractures. P.r.n. Toradol, morphine, and Tylenol for pain and see how she does with this. She will probably need placement at a skilled nursing facility as she has been unable to do her activities of daily living. Will get PT evaluation and Case Management.  3. Urinary tract infection. Will send off urine culture and give ceftriaxone.  4. Anemia. The patient has had steady drop in her hemoglobin. She is on iron supplementation. Will repeat CBC. If still persistent, will consider anemia work-up. In the meantime, will send off Hemoccult stool.  5. Parkinson's. Will continue Sinemet.  6. Hyponatremia, elevated BUN to creatinine ratio which is evidence of dehydration. Will give IV fluids and hold  her Lasix for now.  7. Hypertension. Will continue Tenormin.  8. Hyperlipidemia. Will continue Zocor.  9. Depression. Will continue her Zoloft and Remeron.  10. DVT prophylaxis. Maintain with aspirin and Lovenox.   TOTAL TIME SPENT ON ADMISSION: 55 minutes.   The patient's niece was by bedside and agreeable to the plan.   Thank you for allowing me to participate in the care of this patient.  CODE STATUS: FULL CODE.  ____________________________ Corie Chiquito. Lafayette Dragon, MD aaf:drc D: 05/24/2011 19:05:16 ET T: 05/25/2011 05:44:20 ET JOB#: [id number]  cc: Karolee Ohs A. Lafayette Dragon, MD, <Dictator> Leo Grosser, MD Karolee Ohs Laverda Page MD ELECTRONICALLY SIGNED 05/25/2011 10:31

## 2015-03-30 ENCOUNTER — Encounter: Payer: Self-pay | Admitting: Gastroenterology
